# Patient Record
Sex: Female | Born: 1935 | Race: White | Hispanic: No | Marital: Married | Smoking: Never smoker
Health system: Southern US, Community
[De-identification: ages and names within clinical notes are randomized; demographics above are authoritative.]

## PROBLEM LIST (undated history)

## (undated) ENCOUNTER — Emergency Department (HOSPITAL_BASED_OUTPATIENT_CLINIC_OR_DEPARTMENT_OTHER): Discharge status: Against Medical Advice | Payer: Self-pay

## (undated) DIAGNOSIS — G8929 Other chronic pain: Secondary | ICD-10-CM

## (undated) DIAGNOSIS — K219 Gastro-esophageal reflux disease without esophagitis: Secondary | ICD-10-CM

## (undated) DIAGNOSIS — M069 Rheumatoid arthritis, unspecified: Secondary | ICD-10-CM

## (undated) DIAGNOSIS — F410 Panic disorder [episodic paroxysmal anxiety] without agoraphobia: Secondary | ICD-10-CM

## (undated) HISTORY — PX: CHOLECYSTECTOMY: SHX55

## (undated) HISTORY — PX: KNEE SURGERY: SHX244

---

## 2011-10-22 ENCOUNTER — Emergency Department (HOSPITAL_BASED_OUTPATIENT_CLINIC_OR_DEPARTMENT_OTHER)
Admission: EM | Admit: 2011-10-22 | Discharge: 2011-10-22 | Disposition: A | Discharge status: Home / Self Care | Payer: Medicare Other | Attending: Emergency Medicine | Admitting: Emergency Medicine

## 2011-10-22 ENCOUNTER — Encounter (HOSPITAL_BASED_OUTPATIENT_CLINIC_OR_DEPARTMENT_OTHER): Payer: Self-pay | Admitting: *Deleted

## 2011-10-22 DIAGNOSIS — F411 Generalized anxiety disorder: Secondary | ICD-10-CM | POA: Insufficient documentation

## 2011-10-22 DIAGNOSIS — Z79899 Other long term (current) drug therapy: Secondary | ICD-10-CM | POA: Insufficient documentation

## 2011-10-22 DIAGNOSIS — R21 Rash and other nonspecific skin eruption: Secondary | ICD-10-CM | POA: Insufficient documentation

## 2011-10-22 DIAGNOSIS — L304 Erythema intertrigo: Secondary | ICD-10-CM

## 2011-10-22 DIAGNOSIS — M5430 Sciatica, unspecified side: Secondary | ICD-10-CM

## 2011-10-22 DIAGNOSIS — M549 Dorsalgia, unspecified: Secondary | ICD-10-CM | POA: Insufficient documentation

## 2011-10-22 DIAGNOSIS — K219 Gastro-esophageal reflux disease without esophagitis: Secondary | ICD-10-CM | POA: Insufficient documentation

## 2011-10-22 HISTORY — DX: Panic disorder (episodic paroxysmal anxiety): F41.0

## 2011-10-22 HISTORY — DX: Gastro-esophageal reflux disease without esophagitis: K21.9

## 2011-10-22 MED ORDER — HYDROMORPHONE HCL PF 2 MG/ML IJ SOLN
2.0000 mg | Freq: Once | INTRAMUSCULAR | Status: AC
  Administered 2011-10-22: 2 mg via INTRAMUSCULAR
  Filled 2011-10-22: qty 1

## 2011-10-22 MED ORDER — OXYCODONE-ACETAMINOPHEN 5-325 MG PO TABS: 1.0000 | ORAL_TABLET | Freq: Four times a day (QID) | ORAL | Status: AC | PRN

## 2011-10-22 MED ORDER — KETOROLAC TROMETHAMINE 60 MG/2ML IM SOLN
60.0000 mg | Freq: Once | INTRAMUSCULAR | Status: AC
  Administered 2011-10-22: 60 mg via INTRAMUSCULAR
  Filled 2011-10-22: qty 2

## 2011-10-22 NOTE — ED Notes (Addendum)
Patient started having leg pain around July 10, primary MD had her take prednisone, which initially relieved pain, but has returned with lower back and leg   Pain. MD scheduled an epidural for aug 27. Also has a rash upper chest. Had a MRI last week

## 2011-10-22 NOTE — ED Provider Notes (Signed)
History     CSN: [id number]  Arrival date & time 10/22/11  1040   First MD Initiated Contact with Patient 10/22/11 1054      Chief Complaint  Patient presents with  . Back Pain  . Rash    (Consider location/radiation/quality/duration/timing/severity/associated sxs/prior treatment) HPI Pt with history of sciatica has had increased pain for the last several weeks, seen by her orthpedist and given Rx for steroids without improvement. MRI was done recently and she was told she would need an epidural injection. She has severe aching L sided back and buttock pain, radiating into her L leg. No new injuries, not relieved with Vicodin at home.  She also reports a red, itching rash in her armpits bilaterally onset 3 days ago and worsening until today. She has had similar rash in her groin felt to be fungal infection and improved with Clotrimazole/betamethazone lotion. Denies any new exposures except a muscle relaxer she took this morning for the first time, however as mentioned her rash started 2 days prior.  Past Medical History  Diagnosis Date  . Acid reflux   . Panic attacks     Past Surgical History  Procedure Date  . Knee surgery     bilaterla  . Cholecystectomy     No family history on file.  History  Substance Use Topics  . Smoking status: Never Smoker   . Smokeless tobacco: Not on file  . Alcohol Use: No    OB History    Grav Para Term Preterm Abortions TAB SAB Ect Mult Living                  Review of Systems All other systems reviewed and are negative except as noted in HPI.   Allergies  Review of patient's allergies indicates no known allergies.  Home Medications   Current Outpatient Rx  Name Route Sig Dispense Refill  . ALPRAZOLAM 0.25 MG PO TABS Oral Take 0.25 mg by mouth at bedtime as needed.    . CELEBREX PO Oral Take by mouth.    . CLOTRIMAZOLE-BETAMETHASONE 1-0.05 % EX CREA Topical Apply topically 2 (two) times daily.    Marland Kitchen ESOMEPRAZOLE MAGNESIUM 40  MG PO CPDR Oral Take 40 mg by mouth daily before breakfast.    . HYDROCODONE-ACETAMINOPHEN 5-500 MG PO CAPS Oral Take 1 capsule by mouth every 6 (six) hours as needed.    Marland Kitchen METAXALONE 800 MG PO TABS Oral Take 800 mg by mouth 3 (three) times daily.      BP 160/69  Pulse 99  Temp 97.9 F (36.6 C) (Oral)  Resp 18  SpO2 100%  Physical Exam  Nursing note and vitals reviewed. Constitutional: She is oriented to person, place, and time. She appears well-developed and well-nourished.  HENT:  Head: Normocephalic and atraumatic.  Eyes: EOM are normal. Pupils are equal, round, and reactive to light.  Neck: Normal range of motion. Neck supple.  Cardiovascular: Normal rate, normal heart sounds and intact distal pulses.   Pulmonary/Chest: Effort normal and breath sounds normal.  Abdominal: Bowel sounds are normal. She exhibits no distension. There is no tenderness.  Musculoskeletal: Normal range of motion. She exhibits tenderness. She exhibits no edema.       Tender in L sciatic notch  Neurological: She is alert and oriented to person, place, and time. She has normal strength. No cranial nerve deficit or sensory deficit.  Skin: Skin is warm and dry. Rash (erythematous, non vesicular rash in both axillae) noted.  Psychiatric: She has a normal mood and affect.    ED Course  Procedures (including critical care time)  Labs Reviewed - No data to display No results found.   No diagnosis found.    MDM  Rash is likely fungal given history of same in groin, less likely allergic in etiology. She has a combination antifungal/steroid cream she was advised to use for this rash. No concern for shingles given appearance and bilaterality. No evidence of cellulitis. Pt has had two recent courses of oral steroids for her back, will avoid a third. Given pain medications IM here, plan for discharge with Ortho followup.         Charles B. Bernette Mayers, MD 10/22/11 1226

## 2011-10-29 ENCOUNTER — Encounter (HOSPITAL_BASED_OUTPATIENT_CLINIC_OR_DEPARTMENT_OTHER): Payer: Self-pay | Admitting: *Deleted

## 2011-10-29 ENCOUNTER — Emergency Department (HOSPITAL_BASED_OUTPATIENT_CLINIC_OR_DEPARTMENT_OTHER)
Admission: EM | Admit: 2011-10-29 | Discharge: 2011-10-29 | Disposition: A | Discharge status: Home / Self Care | Payer: Medicare Other | Attending: Emergency Medicine | Admitting: Emergency Medicine

## 2011-10-29 DIAGNOSIS — M545 Low back pain, unspecified: Secondary | ICD-10-CM | POA: Insufficient documentation

## 2011-10-29 DIAGNOSIS — G8929 Other chronic pain: Secondary | ICD-10-CM | POA: Insufficient documentation

## 2011-10-29 DIAGNOSIS — F410 Panic disorder [episodic paroxysmal anxiety] without agoraphobia: Secondary | ICD-10-CM | POA: Insufficient documentation

## 2011-10-29 DIAGNOSIS — M549 Dorsalgia, unspecified: Secondary | ICD-10-CM

## 2011-10-29 DIAGNOSIS — K219 Gastro-esophageal reflux disease without esophagitis: Secondary | ICD-10-CM | POA: Insufficient documentation

## 2011-10-29 MED ORDER — OXYCODONE-ACETAMINOPHEN 5-325 MG PO TABS: 1.0000 | ORAL_TABLET | Freq: Four times a day (QID) | ORAL | Status: AC | PRN

## 2011-10-29 NOTE — ED Notes (Signed)
Family at bedside. 

## 2011-10-29 NOTE — ED Provider Notes (Signed)
History  This chart was scribed for Shelda Jakes, MD by Ladona Ridgel Day. This patient was seen in room MHT13/MHT13 and the patient's care was started at 1430.   CSN: [id number]  Arrival date & time 10/29/11  1430   First MD Initiated Contact with Patient 10/29/11 1507      Chief Complaint  Patient presents with  . Back Pain   Patient is a 78 y.o. female presenting with back pain. The history is provided by the patient. No language interpreter was used.  Back Pain  This is a chronic problem. The current episode started more than 1 week ago. The problem occurs constantly. The problem has been gradually worsening. The pain is associated with no known injury. The pain is present in the lumbar spine. The pain does not radiate. The pain is moderate. The pain is the same all the time. Pertinent negatives include no chest pain, no fever, no numbness, no abdominal pain, no dysuria and no weakness. Treatments tried: Percocet. The treatment provided mild relief.  Jodi Fox is a 78 y.o. female who presents to the Emergency Department with chronic back pain complaining of needing a prescription refill for her back pain. She states has an epidural scheduled in 3 days and is running out of her medication. She has four percocet tablets left of her originally 50 prescribed. She states that she gets an epidural every few years which significantly helps her back pain but she states that she will run out of her pain medicine before she gets her epidural in 3 days. Her last epidural was 7 years ago. She also complains of associated chronic left knee pain. She denies any numbness/weakness of her left foot. She denies any SOB, chest pain, fever, and urinary symptoms.  Her PCP is Dr. Joanie Coddington at [facility] Her orthopedist is Dr. Reinaldo Raddle at [facility] Past Medical History  Diagnosis Date  . Acid reflux   . Panic attacks     Past Surgical History  Procedure Date  . Knee surgery     bilaterla  . Cholecystectomy      History reviewed. No pertinent family history.  History  Substance Use Topics  . Smoking status: Never Smoker   . Smokeless tobacco: Not on file  . Alcohol Use: No    OB History    Grav Para Term Preterm Abortions TAB SAB Ect Mult Living                  Review of Systems  Constitutional: Negative for fever and chills.  HENT: Negative for congestion and neck pain.   Respiratory: Negative for shortness of breath.   Cardiovascular: Negative for chest pain.  Gastrointestinal: Negative for nausea, vomiting, abdominal pain and diarrhea.  Genitourinary: Negative for dysuria and difficulty urinating.  Musculoskeletal: Positive for back pain.       Chronic left knee pain.   Neurological: Negative for weakness and numbness.  All other systems reviewed and are negative.    Allergies  Review of patient's allergies indicates no known allergies.  Home Medications   Current Outpatient Rx  Name Route Sig Dispense Refill  . ALPRAZOLAM 0.25 MG PO TABS Oral Take 0.25 mg by mouth at bedtime as needed.    . CELEBREX PO Oral Take by mouth.    . CLOTRIMAZOLE-BETAMETHASONE 1-0.05 % EX CREA Topical Apply topically 2 (two) times daily.    Marland Kitchen ESOMEPRAZOLE MAGNESIUM 40 MG PO CPDR Oral Take 40 mg by mouth daily before breakfast.    .  METAXALONE 800 MG PO TABS Oral Take 800 mg by mouth 3 (three) times daily.    . OXYCODONE-ACETAMINOPHEN 5-325 MG PO TABS Oral Take 1-2 tablets by mouth every 6 (six) hours as needed for pain. 50 tablet 0  . OXYCODONE-ACETAMINOPHEN 5-325 MG PO TABS Oral Take 1-2 tablets by mouth every 6 (six) hours as needed for pain. 50 tablet 0    Triage Vitals: BP 144/55  Pulse 80  Temp 97.5 F (36.4 C) (Oral)  Resp 20  Ht 5\' 3"  (1.6 m)  Wt 145 lb (65.772 kg)  BMI 25.69 kg/m2  SpO2 100%  Physical Exam  Nursing note and vitals reviewed. Constitutional: She is oriented to person, place, and time. She appears well-developed and well-nourished. No distress.  HENT:    Head: Normocephalic and atraumatic.  Eyes: EOM are normal.  Neck: Neck supple. No tracheal deviation present.  Cardiovascular: Normal rate, regular rhythm and normal heart sounds.   No murmur heard. Pulmonary/Chest: Effort normal and breath sounds normal. No respiratory distress. She has no wheezes. She has no rales.  Abdominal: Soft. Bowel sounds are normal. She exhibits no distension. There is no tenderness. There is no rebound and no guarding.  Musculoskeletal: Normal range of motion.  Neurological: She is alert and oriented to person, place, and time.  Skin: Skin is warm and dry.  Psychiatric: She has a normal mood and affect. Her behavior is normal.    ED Course  Procedures (including critical care time) DIAGNOSTIC STUDIES: Oxygen Saturation is 100% on room air, normal by my interpretation.    COORDINATION OF CARE: At 335 PM Discussed treatment plan with patient which includes pain medicine. Patient agrees.   Labs Reviewed - No data to display No results found.   1. Back pain       MDM    Patient seen here one week ago assessment chronic back pain issues is followed by orthopedics in [place] is scheduled for an epidural which is helped her in the past. She ran out of her Percocet as not to get her until the epidural is done which can be done this week coming up. No snig changes in her pain pattern or Burow focal changes.   I personally performed the services described in this documentation, which was scribed in my presence. The recorded information has been reviewed and considered.           Shelda Jakes, MD 10/29/11 [phone]

## 2011-10-29 NOTE — ED Notes (Signed)
Pt states she has been having back problems since mid July. Has had xrays and MRI and is scheduled for an epidural on Tues. "Running out of pain meds. " Seen here for same on the 17th. Given injections.

## 2011-11-27 ENCOUNTER — Emergency Department (HOSPITAL_BASED_OUTPATIENT_CLINIC_OR_DEPARTMENT_OTHER)
Admission: EM | Admit: 2011-11-27 | Discharge: 2011-11-28 | Disposition: A | Discharge status: Home / Self Care | Payer: Medicare Other | Attending: Emergency Medicine | Admitting: Emergency Medicine

## 2011-11-27 ENCOUNTER — Encounter (HOSPITAL_BASED_OUTPATIENT_CLINIC_OR_DEPARTMENT_OTHER): Payer: Self-pay | Admitting: *Deleted

## 2011-11-27 DIAGNOSIS — G8929 Other chronic pain: Secondary | ICD-10-CM | POA: Insufficient documentation

## 2011-11-27 DIAGNOSIS — Z881 Allergy status to other antibiotic agents status: Secondary | ICD-10-CM | POA: Insufficient documentation

## 2011-11-27 DIAGNOSIS — M549 Dorsalgia, unspecified: Secondary | ICD-10-CM | POA: Insufficient documentation

## 2011-11-27 DIAGNOSIS — Z888 Allergy status to other drugs, medicaments and biological substances status: Secondary | ICD-10-CM | POA: Insufficient documentation

## 2011-11-27 HISTORY — DX: Other chronic pain: G89.29

## 2011-11-27 MED ORDER — KETOROLAC TROMETHAMINE 60 MG/2ML IM SOLN
60.0000 mg | Freq: Once | INTRAMUSCULAR | Status: AC
  Administered 2011-11-27: 60 mg via INTRAMUSCULAR
  Filled 2011-11-27: qty 2

## 2011-11-27 MED ORDER — HYDROMORPHONE HCL PF 2 MG/ML IJ SOLN
2.0000 mg | Freq: Once | INTRAMUSCULAR | Status: AC
  Administered 2011-11-27: 2 mg via INTRAMUSCULAR
  Filled 2011-11-27: qty 1

## 2011-11-27 NOTE — ED Notes (Signed)
MD at bedside. 

## 2011-11-27 NOTE — ED Notes (Signed)
Pt has been in treatment for pinched nerve- c/o increased pain in neck shoulder and hands since wednesday

## 2011-11-28 MED ORDER — OXYCODONE-ACETAMINOPHEN 5-325 MG PO TABS: 1.0000 | ORAL_TABLET | ORAL | Status: DC | PRN

## 2011-11-28 NOTE — ED Provider Notes (Signed)
History     CSN: [id number]  Arrival date & time 11/27/11  2145   First MD Initiated Contact with Patient 11/27/11 2238      Chief Complaint  Patient presents with  . 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 She has been treated for "pinched nerve" since last summer. She has been followed by Dr. Louis Matte, her orthopedist, and by Dr. Adele Dan, her physical medicine and rehabilitation specialist..  she has been treated with epidural injections with some relief. She had been given a steroid injection her left hip, which she feels is given her adverse reaction, the feeling that the steroid is invaded her bicycle. She says this has made her ache all over. More recently, she has had pain in her neck with radiation to the arms. She had been seen 2 days ago and had cervical spine films but doesn't know what they showed. Her pain was quite severe this evening and she therefore sought evaluation.  Patient is a 78 y.o. female presenting with back pain. The history is provided by the patient, the spouse and medical records. No language interpreter was used.  Back Pain  This is a chronic problem. Episode onset: Back pain on and off for several months. The problem occurs daily. The problem has been gradually worsening. The pain is associated with no known injury. The pain is present in the lumbar spine. The quality of the pain is described as shooting. The pain radiates to the left thigh. The pain is at a severity of 9/10. The pain is severe. The symptoms are aggravated by certain positions. Associated symptoms include paresthesias. Treatments tried: Prior treatment with epidural injections, with transient improvement. Risk factors include lack of exercise and a sedentary lifestyle.    Past Medical History  Diagnosis Date  . Acid  reflux   . Panic attacks   . Chronic pain     Past Surgical History  Procedure Date  . Knee surgery     bilaterla  . Cholecystectomy     No family history on file.  History  Substance Use Topics  . Smoking status: Never Smoker   . Smokeless tobacco: Never Used  . Alcohol Use: No    OB History    Grav Para Term Preterm Abortions TAB SAB Ect Mult Living                  Review of Systems  Musculoskeletal: Positive for back pain.  Neurological: Positive for paresthesias.  All other systems reviewed and are negative.    Allergies  Prednisone and Statins  Home Medications   Current Outpatient Rx  Name Route Sig Dispense Refill  . OXYCODONE-ACETAMINOPHEN 5-500 MG PO CAPS Oral Take 2 capsules by mouth every 4 (four) hours as needed.    . ALPRAZOLAM 0.25 MG PO TABS Oral Take 0.25 mg by mouth at bedtime as needed.    . CELEBREX PO Oral Take by mouth.    . CLOTRIMAZOLE-BETAMETHASONE 1-0.05 % EX CREA Topical Apply topically 2 (two) times daily.    Marland Kitchen ESOMEPRAZOLE MAGNESIUM 40 MG PO CPDR Oral Take 40 mg by mouth daily before breakfast.    . METAXALONE 800 MG PO TABS Oral Take 800 mg by mouth 3 (three) times daily.    . OXYCODONE-ACETAMINOPHEN 5-325 MG PO TABS Oral Take 1 tablet by mouth every 4 (four) hours as needed for  pain. 20 tablet 0    BP 129/56  Pulse 108  Temp 98.4 F (36.9 C) (Oral)  Resp 18  Ht 5\' 3"  (1.6 m)  Wt 140 lb (63.504 kg)  BMI 24.80 kg/m2  SpO2 97%  Physical Exam  Nursing note and vitals reviewed. Constitutional: She is oriented to person, place, and time. She appears well-developed and well-nourished.       In moderate distress with neck and low back pain.  HENT:  Head: Normocephalic and atraumatic.  Right Ear: External ear normal.  Left Ear: External ear normal.  Mouth/Throat: Oropharynx is clear and moist.  Eyes: Conjunctivae normal and EOM are normal. Pupils are equal, round, and reactive to light.  Neck: Neck supple.       Some pain  elicited on rotary movement of her neck.    Cardiovascular: Normal rate, regular rhythm and normal heart sounds.   Pulmonary/Chest: Effort normal and breath sounds normal.  Abdominal: Soft. Bowel sounds are normal.  Musculoskeletal:       No palpable bony deformity or point tenderness over the cervical, thoracic, or lumbar spine.  Neurological: She is alert and oriented to person, place, and time.       No sensory or motor deficit.  Skin: Skin is warm and dry.  Psychiatric: She has a normal mood and affect. Her behavior is normal.    ED Course  Procedures (including critical care time)  DISP:  Pt. was seen and had physical examination. Old charts were reviewed. She was given an injection of Toradol and Dilaudid. She got good relief from this. She was given a prescription for Percocet every 4 hours as needed for pain. She is to followup with her physical medicine and rehabilitation Dr., Dr. Adele Dan.    1. Back pain      Carleene Cooper III, MD 11/28/11 [phone]

## 2011-12-06 ENCOUNTER — Encounter (HOSPITAL_BASED_OUTPATIENT_CLINIC_OR_DEPARTMENT_OTHER): Payer: Self-pay | Admitting: *Deleted

## 2011-12-06 ENCOUNTER — Emergency Department (HOSPITAL_BASED_OUTPATIENT_CLINIC_OR_DEPARTMENT_OTHER)
Admission: EM | Admit: 2011-12-06 | Discharge: 2011-12-06 | Disposition: A | Discharge status: Home / Self Care | Payer: Medicare Other | Attending: Emergency Medicine | Admitting: Emergency Medicine

## 2011-12-06 DIAGNOSIS — M549 Dorsalgia, unspecified: Secondary | ICD-10-CM | POA: Insufficient documentation

## 2011-12-06 DIAGNOSIS — F411 Generalized anxiety disorder: Secondary | ICD-10-CM | POA: Insufficient documentation

## 2011-12-06 DIAGNOSIS — K219 Gastro-esophageal reflux disease without esophagitis: Secondary | ICD-10-CM | POA: Insufficient documentation

## 2011-12-06 DIAGNOSIS — G8929 Other chronic pain: Secondary | ICD-10-CM | POA: Insufficient documentation

## 2011-12-06 MED ORDER — KETOROLAC TROMETHAMINE 60 MG/2ML IM SOLN
60.0000 mg | Freq: Once | INTRAMUSCULAR | Status: AC
  Administered 2011-12-06: 60 mg via INTRAMUSCULAR
  Filled 2011-12-06: qty 2

## 2011-12-06 MED ORDER — HYDROMORPHONE HCL PF 1 MG/ML IJ SOLN
1.0000 mg | Freq: Once | INTRAMUSCULAR | Status: AC
  Administered 2011-12-06: 1 mg via INTRAMUSCULAR
  Filled 2011-12-06: qty 1

## 2011-12-06 NOTE — ED Provider Notes (Signed)
History     CSN: [id number]  Arrival date & time 12/06/11  1933   First MD Initiated Contact with Patient 12/06/11 1951      Chief Complaint  Patient presents with  . Back Pain    (Consider location/radiation/quality/duration/timing/severity/associated sxs/prior treatment) HPI Pt with history of chronic back pain, has been seen several times in the ED recently as well as appointments with her other outpatient doctors. Was feeling better after epidural injection a few weeks ago but pain has returned and worsened for the last several days. In addition to her back pain, she now also complains of neck, R knee and bilateral hand pain. Apparently had bloodwork drawn at PCP office today concerning for rheumatoid arthritis. Pt has been taking her oxycodone without improvement today. No fevers. No falls or injuries.   Past Medical History  Diagnosis Date  . Acid reflux   . Panic attacks   . Chronic pain     Past Surgical History  Procedure Date  . Knee surgery     bilaterla  . Cholecystectomy     No family history on file.  History  Substance Use Topics  . Smoking status: Never Smoker   . Smokeless tobacco: Never Used  . Alcohol Use: No    OB History    Grav Para Term Preterm Abortions TAB SAB Ect Mult Living                  Review of Systems All other systems reviewed and are negative except as noted in HPI.   Allergies  Prednisone and Statins  Home Medications   Current Outpatient Rx  Name Route Sig Dispense Refill  . ALPRAZOLAM 0.25 MG PO TABS Oral Take 0.25 mg by mouth at bedtime as needed.    . CELEBREX PO Oral Take by mouth.    . CLOTRIMAZOLE-BETAMETHASONE 1-0.05 % EX CREA Topical Apply topically 2 (two) times daily.    Marland Kitchen ESOMEPRAZOLE MAGNESIUM 40 MG PO CPDR Oral Take 40 mg by mouth daily before breakfast.    . METAXALONE 800 MG PO TABS Oral Take 800 mg by mouth 3 (three) times daily.    . OXYCODONE-ACETAMINOPHEN 5-325 MG PO TABS Oral Take 1 tablet by mouth  every 4 (four) hours as needed for pain. 20 tablet 0  . OXYCODONE-ACETAMINOPHEN 5-500 MG PO CAPS Oral Take 2 capsules by mouth every 4 (four) hours as needed.      BP 117/78  Pulse 104  Temp 99.2 F (37.3 C) (Oral)  Resp 18  SpO2 98%  Physical Exam  Nursing note and vitals reviewed. Constitutional: She is oriented to person, place, and time. She appears well-developed and well-nourished.       Uncomfortable appearing  HENT:  Head: Normocephalic and atraumatic.  Eyes: EOM are normal. Pupils are equal, round, and reactive to light.  Neck: Normal range of motion. Neck supple.  Cardiovascular: Normal rate, normal heart sounds and intact distal pulses.   Pulmonary/Chest: Effort normal and breath sounds normal.  Abdominal: Bowel sounds are normal. She exhibits no distension. There is no tenderness.  Musculoskeletal: Normal range of motion. She exhibits tenderness (tenderness to light touch diffusely, particularly over the strap muscles in the neck and the hands bilaterally). She exhibits no edema.  Neurological: She is alert and oriented to person, place, and time. She has normal strength. No cranial nerve deficit or sensory deficit.  Skin: Skin is warm and dry. No rash noted.  Psychiatric: She has a normal mood  and affect.    ED Course  Procedures (including critical care time)  Labs Reviewed - No data to display No results found.   No diagnosis found.    MDM  Exacerbation of chronic pain. Pt advised that the ED could no longer prescribe her narcotic medications and that she would need to followup with her outpatient doctors tomorrow for long term pain control. She was given IM medications in the ED. Will reassess.   9:36 PM Called to room to speak to husband who is asking for 'an ambulance to take [place] to [facility]'. Advised that an ambulance would not come to the ED if he called. Offered additional pain medications but he refuses. States he wants to leave now. Advised to return  for any concerns. Advises that the ED is always available to evaluate the patient if needed for future exacerbations of her chronic pain.        Trude Cansler B. Bernette Mayers, MD 12/06/11 2139

## 2011-12-06 NOTE — ED Notes (Signed)
Pt's family requesting that pt be dc'd so that she can go to [facility] for further treatment. Pt offered more pain meds by EDP but pt declined at this time. Pt placed in w/c, discharge papers given and pt placed in car with assistance. Pt in NAD distress upon d/c.

## 2011-12-06 NOTE — ED Notes (Signed)
Lower back pain. MRI a month ago. She had an epidural injection and into her left hip for pain. Here for pain in her hands and upper back.

## 2019-02-22 ENCOUNTER — Other Ambulatory Visit: Payer: Self-pay

## 2019-02-22 ENCOUNTER — Emergency Department (HOSPITAL_BASED_OUTPATIENT_CLINIC_OR_DEPARTMENT_OTHER): Payer: Medicare Other

## 2019-02-22 ENCOUNTER — Encounter (HOSPITAL_BASED_OUTPATIENT_CLINIC_OR_DEPARTMENT_OTHER): Payer: Self-pay | Admitting: Emergency Medicine

## 2019-02-22 ENCOUNTER — Emergency Department (HOSPITAL_BASED_OUTPATIENT_CLINIC_OR_DEPARTMENT_OTHER)
Admission: EM | Admit: 2019-02-22 | Discharge: 2019-02-23 | Disposition: A | Discharge status: Home / Self Care | Payer: Medicare Other | Attending: Emergency Medicine | Admitting: Emergency Medicine

## 2019-02-22 DIAGNOSIS — S79912A Unspecified injury of left hip, initial encounter: Secondary | ICD-10-CM

## 2019-02-22 DIAGNOSIS — S73192A Other sprain of left hip, initial encounter: Secondary | ICD-10-CM

## 2019-02-22 DIAGNOSIS — N3000 Acute cystitis without hematuria: Secondary | ICD-10-CM

## 2019-02-22 DIAGNOSIS — Z79899 Other long term (current) drug therapy: Secondary | ICD-10-CM | POA: Insufficient documentation

## 2019-02-22 DIAGNOSIS — Z96641 Presence of right artificial hip joint: Secondary | ICD-10-CM | POA: Insufficient documentation

## 2019-02-22 DIAGNOSIS — Y929 Unspecified place or not applicable: Secondary | ICD-10-CM | POA: Insufficient documentation

## 2019-02-22 DIAGNOSIS — Y999 Unspecified external cause status: Secondary | ICD-10-CM | POA: Diagnosis not present

## 2019-02-22 DIAGNOSIS — Z20828 Contact with and (suspected) exposure to other viral communicable diseases: Secondary | ICD-10-CM | POA: Insufficient documentation

## 2019-02-22 DIAGNOSIS — Y939 Activity, unspecified: Secondary | ICD-10-CM | POA: Diagnosis not present

## 2019-02-22 DIAGNOSIS — E871 Hypo-osmolality and hyponatremia: Secondary | ICD-10-CM

## 2019-02-22 DIAGNOSIS — Z96653 Presence of artificial knee joint, bilateral: Secondary | ICD-10-CM | POA: Insufficient documentation

## 2019-02-22 DIAGNOSIS — R2689 Other abnormalities of gait and mobility: Secondary | ICD-10-CM

## 2019-02-22 DIAGNOSIS — X58XXXA Exposure to other specified factors, initial encounter: Secondary | ICD-10-CM | POA: Insufficient documentation

## 2019-02-22 DIAGNOSIS — R252 Cramp and spasm: Secondary | ICD-10-CM

## 2019-02-22 HISTORY — DX: Rheumatoid arthritis, unspecified: M06.9

## 2019-02-22 LAB — COMPREHENSIVE METABOLIC PANEL
ALT: 16 U/L (ref 0–44)
AST: 18 U/L (ref 15–41)
Albumin: 3.4 g/dL — ABNORMAL LOW (ref 3.5–5.0)
Alkaline Phosphatase: 46 U/L (ref 38–126)
Anion gap: 7 (ref 5–15)
BUN: 17 mg/dL (ref 8–23)
CO2: 23 mmol/L (ref 22–32)
Calcium: 8.7 mg/dL — ABNORMAL LOW (ref 8.9–10.3)
Chloride: 96 mmol/L — ABNORMAL LOW (ref 98–111)
Creatinine, Ser: 0.96 mg/dL (ref 0.44–1.00)
GFR calc Af Amer: >60 mL/min (ref >60)
GFR calc non Af Amer: 55 mL/min — ABNORMAL LOW (ref >60)
Glucose, Bld: 110 mg/dL — ABNORMAL HIGH (ref 70–99)
Potassium: 4.3 mmol/L (ref 3.5–5.1)
Sodium: 126 mmol/L — ABNORMAL LOW (ref 135–145)
Total Bilirubin: 1 mg/dL (ref 0.3–1.2)
Total Protein: 6.7 g/dL (ref 6.5–8.1)

## 2019-02-22 LAB — URINALYSIS, ROUTINE W REFLEX MICROSCOPIC
Bilirubin Urine: NEGATIVE
Glucose, UA: NEGATIVE mg/dL
Ketones, ur: NEGATIVE mg/dL
Nitrite: NEGATIVE
Protein, ur: NEGATIVE mg/dL
Specific Gravity, Urine: 1.01 (ref 1.005–1.030)
pH: 7.5 (ref 5.0–8.0)

## 2019-02-22 LAB — CBC WITH DIFFERENTIAL/PLATELET
Abs Immature Granulocytes: 0.03 10*3/uL (ref 0.00–0.07)
Basophils Absolute: 0 10*3/uL (ref 0.0–0.1)
Basophils Relative: 0 %
Eosinophils Absolute: 0.2 10*3/uL (ref 0.0–0.5)
Eosinophils Relative: 3 %
HCT: 29.3 % — ABNORMAL LOW (ref 36.0–46.0)
Hemoglobin: 10 g/dL — ABNORMAL LOW (ref 12.0–15.0)
Immature Granulocytes: 1 %
Lymphocytes Relative: 16 %
Lymphs Abs: 1 10*3/uL (ref 0.7–4.0)
MCH: 33.1 pg (ref 26.0–34.0)
MCHC: 34.1 g/dL (ref 30.0–36.0)
MCV: 97 fL (ref 80.0–100.0)
Monocytes Absolute: 0.8 10*3/uL (ref 0.1–1.0)
Monocytes Relative: 14 %
Neutro Abs: 3.9 10*3/uL (ref 1.7–7.7)
Neutrophils Relative %: 66 %
Platelets: 175 10*3/uL (ref 150–400)
RBC: 3.02 MIL/uL — ABNORMAL LOW (ref 3.87–5.11)
RDW: 13.5 % (ref 11.5–15.5)
WBC: 5.8 10*3/uL (ref 4.0–10.5)
nRBC: 0 % (ref 0.0–0.2)

## 2019-02-22 LAB — URINALYSIS, MICROSCOPIC (REFLEX)

## 2019-02-22 LAB — CK: Total CK: 33 U/L — ABNORMAL LOW (ref 38–234)

## 2019-02-22 MED ORDER — DIAZEPAM 2 MG PO TABS
2.0000 mg | ORAL_TABLET | Freq: Once | ORAL | Status: AC
  Administered 2019-02-22: 23:00:00 2 mg via ORAL
  Filled 2019-02-22: qty 1

## 2019-02-22 MED ORDER — FENTANYL CITRATE (PF) 100 MCG/2ML IJ SOLN
50.0000 ug | Freq: Once | INTRAMUSCULAR | Status: AC
  Administered 2019-02-22: 50 ug via INTRAVENOUS
  Filled 2019-02-22: qty 2

## 2019-02-22 MED ORDER — SODIUM CHLORIDE 0.9 % IV BOLUS
500.0000 mL | Freq: Once | INTRAVENOUS | Status: AC
  Administered 2019-02-22: 23:00:00 500 mL via INTRAVENOUS

## 2019-02-22 MED ORDER — CEPHALEXIN 500 MG PO CAPS: 500.0000 mg | ORAL_CAPSULE | Freq: Four times a day (QID) | ORAL | 0 refills | Status: DC

## 2019-02-22 MED ORDER — SODIUM CHLORIDE 0.9 % IV SOLN
1.0000 g | Freq: Once | INTRAVENOUS | Status: AC
  Administered 2019-02-22: 1 g via INTRAVENOUS
  Filled 2019-02-22: qty 10

## 2019-02-22 NOTE — ED Provider Notes (Signed)
[facility] EMERGENCY DEPARTMENT Provider Note   CSN: [id number] Arrival date & time: 02/22/19  1938     History Chief Complaint  Patient presents with  . Leg Pain    Jodi Fox is a 85 y.o. female.  Pt presents to the ED today with left upper leg pain.  The pt said it started hurting yesterday.  She denies any trauma.  She took a Microbiologistpercocet around [id number] which has helped with the pain.  It is worse with bearing weight.  Pt denies any redness, masses, or swelling.         Past Medical History:  Diagnosis Date  . Acid reflux   . Chronic pain   . Panic attacks     Past Medical History:  Diagnosis Date  . Acute urinary tract infection  . Adjustment disorder with anxious mood  . Anterior basement membrane dystrophy  . Benign essential hypertension  . Bilateral pseudophakia  . Blepharitis of upper and lower eyelids of both eyes  . BPPV (benign paroxysmal positional vertigo)  . Cervical spondylosis  . Costochondritis  . Endophthalmitis 11/26/2013  . Endophthalmitis, left eye  . Esophageal stricture  . Exanthem  . Facial swelling  . Fatigue  . GERD (gastroesophageal reflux disease)  . Glaucoma  . Globus hystericus  . H/O recurrent urinary tract infection  . High risk medication use  . History of iron deficiency anemia  . Hyperlipemia  . Idiopathic peripheral neuropathy  . Keratoconjunctivitis sicca (HCC)  . Lumbar stenosis  . Macrocytosis  . Major depressive disorder, recurrent (HCC)  . Osteoarthrosis, generalized, involving multiple sites  . Osteoporosis  . Rash  . Rheumatoid arthritis involving multiple sites with positive rheumatoid factor (HCC)  . Rheumatoid arthritis(714.0)  . Trochanteric bursitis  . Vitamin D deficiency    There are no problems to display for this patient.   Past Surgical History:  Procedure Laterality Date  . CHOLECYSTECTOMY    . KNEE SURGERY     bilaterla    Past Surgical History:  Procedure Laterality Date  .  CARPAL TUNNEL RELEASE Right  . CATARACT EXTRACTION W/ INTRAOCULAR LENS IMPLANT Right 07/14/2011  . CATARACT EXTRACTION W/ INTRAOCULAR LENS IMPLANT Left 11/21/2013  . CHOLECYSTECTOMY  . intravitreal tap and injection Left 11-26-2013  . JOINT REPLACEMENT Bilateral  TKR  . KNEE ARTHROSCOPY W/ MENISCECTOMY  . KNEE ARTHROSCOPY W/ SYNOVECTOMY  . KNEE SURGERY  . SLT Left 10/08/2013  . TIBIA FRACTURE SURGERY  . TOTAL HIP REPLACEMENT - POSTERIOR APPROACH Right 03/14/2018  Procedure: TOTAL HIP REPLACEMENT - POSTERIOR APPROACH; Surgeon: Leanne LovelyKenneth Charles Lennon, MD; Location: [facility] MAIN OR; Service: Orthopedics; Laterality: Right;  . YAG CAPSULOTOMY Right 05/07/2013   OB History   No obstetric history on file.     History reviewed. No pertinent family history. Family History  Problem Relation Age of Onset  . Heart disease Mother  . Cataracts Mother  . Diabetes Father  . Heart disease Father  . Cataracts Father  . Pancreatic cancer Sister  . Diabetes Sister  . Hypertension Sister  . Heart disease Sister  . Hypertension Brother  . Cataracts Maternal Grandmother  . Cataracts Maternal Grandfather  . Cataracts Paternal Grandmother  . Cataracts Paternal Grandfather   Social History   Tobacco Use  . Smoking status: Never Smoker  . Smokeless tobacco: Never Used  Substance Use Topics  . Alcohol use: No  . Drug use: No    Home Medications Prior to Admission medications   Medication  Sig Start Date End Date Taking? Authorizing Provider  ALPRAZolam (XANAX) 0.25 MG tablet Take 0.25 mg by mouth at bedtime as needed.    [provider]  Celecoxib (CELEBREX PO) Take by mouth.    [provider]  cephALEXin (KEFLEX) 500 MG capsule Take 1 capsule (500 mg total) by mouth 4 (four) times daily. 02/22/19   Isla Pence, MD  clotrimazole-betamethasone (LOTRISONE) cream Apply topically 2 (two) times daily.    [provider]  esomeprazole (NEXIUM) 40 MG capsule Take 40 mg  by mouth daily before breakfast.    [provider]  metaxalone (SKELAXIN) 800 MG tablet Take 800 mg by mouth 3 (three) times daily.    [provider]  oxyCODONE-acetaminophen (PERCOCET/ROXICET) 5-325 MG per tablet Take 1 tablet by mouth every 4 (four) hours as needed for pain. 11/28/11   Mylinda Latina, MD  oxyCODONE-acetaminophen (TYLOX) 5-500 MG per capsule Take 2 capsules by mouth every 4 (four) hours as needed.    [provider]    Allergies    Prednisone and Statins  Review of Systems   Review of Systems  Musculoskeletal:       Left upper thigh pain  All other systems reviewed and are negative.   Physical Exam Updated Vital Signs BP (!) 117/55 (BP Location: Right Arm)   Pulse 74   Temp 99.8 F (37.7 C) (Oral)   Resp 18   Ht 5\' 3"  (1.6 m)   Wt 63.5 kg   SpO2 99%   BMI 24.80 kg/m   Physical Exam Vitals and nursing note reviewed.  Constitutional:      Appearance: Normal appearance.  HENT:     Head: Normocephalic and atraumatic.     Right Ear: External ear normal.     Left Ear: External ear normal.     Nose: Nose normal.     Mouth/Throat:     Mouth: Mucous membranes are moist.     Pharynx: Oropharynx is clear.  Eyes:     Extraocular Movements: Extraocular movements intact.     Conjunctiva/sclera: Conjunctivae normal.     Pupils: Pupils are equal, round, and reactive to light.  Cardiovascular:     Rate and Rhythm: Normal rate and regular rhythm.     Pulses: Normal pulses.     Heart sounds: Normal heart sounds.  Pulmonary:     Effort: Pulmonary effort is normal.     Breath sounds: Normal breath sounds.  Abdominal:     General: Abdomen is flat. Bowel sounds are normal.     Palpations: Abdomen is soft.  Musculoskeletal:     Cervical back: Normal range of motion and neck supple.       Legs:  Skin:    General: Skin is warm and dry.     Capillary Refill: Capillary refill takes less than 2 seconds.  Neurological:     General: No  focal deficit present.     Mental Status: She is alert and oriented to person, place, and time.  Psychiatric:        Mood and Affect: Mood normal.        Behavior: Behavior normal.        Thought Content: Thought content normal.        Judgment: Judgment normal.     ED Results / Procedures / Treatments   Labs (all labs ordered are listed, but only abnormal results are displayed) Labs Reviewed  CBC WITH DIFFERENTIAL/PLATELET - Abnormal; Notable for the following components:  Result Value   RBC 3.02 (*)    Hemoglobin 10.0 (*)    HCT 29.3 (*)    All other components within normal limits  COMPREHENSIVE METABOLIC PANEL - Abnormal; Notable for the following components:   Sodium 126 (*)    Chloride 96 (*)    Glucose, Bld 110 (*)    Calcium 8.7 (*)    Albumin 3.4 (*)    GFR calc non Af Amer 55 (*)    All other components within normal limits  URINALYSIS, ROUTINE W REFLEX MICROSCOPIC - Abnormal; Notable for the following components:   Color, Urine COLORLESS (*)    APPearance HAZY (*)    Hgb urine dipstick TRACE (*)    Leukocytes,Ua LARGE (*)    All other components within normal limits  CK - Abnormal; Notable for the following components:   Total CK 33 (*)    All other components within normal limits  URINALYSIS, MICROSCOPIC (REFLEX) - Abnormal; Notable for the following components:   Bacteria, UA MANY (*)    Non Squamous Epithelial PRESENT (*)    All other components within normal limits  SARS CORONAVIRUS 2 (TAT 6-24 HRS)    EKG None  Radiology CT Hip Left Wo Contrast  Result Date: 02/22/2019 CLINICAL DATA:  Left-sided hip pain with concern for a fracture. EXAM: CT OF THE LEFT HIP WITHOUT CONTRAST TECHNIQUE: Multidetector CT imaging of the left hip was performed according to the standard protocol. Multiplanar CT image reconstructions were also generated. COMPARISON:  None. FINDINGS: Bones/Joint/Cartilage There is diffuse osteopenia which limits detection of nondisplaced  fractures. Given this limitation, no acute displaced fracture was identified. Ligaments Suboptimally assessed by CT. Muscles and Tendons Unremarkable. Soft tissues There is sigmoid diverticulosis without CT evidence for diverticulitis. Vascular calcifications are noted. IMPRESSION: Diffuse osteopenia limits detection of nondisplaced fractures. Given this limitation, no acute displaced fracture was identified. If there is persistent high clinical suspicion for an occult fracture, follow-up with MRI is recommended. Electronically Signed   By: Katherine Mantle M.D.   On: 02/22/2019 21:37   DG Femur Min 2 Views Left  Result Date: 02/22/2019 CLINICAL DATA:  Pain, acute onset left thigh pain no history of injury. EXAM: LEFT FEMUR 2 VIEWS COMPARISON:  None FINDINGS: Joint space narrowing and osteophyte formation about the left hip. Signs of bone demineralization. Signs of left total knee arthroplasty. Vascular calcifications in the soft tissues similar to prior studies of the pelvis which are available for review. No signs of acute fracture of the left femur. IMPRESSION: Degenerative changes in the hip, post left total knee arthroplasty with osteopenia and without signs of acute fracture. Electronically Signed   By: Donzetta Kohut M.D.   On: 02/22/2019 20:51    Procedures Procedures (including critical care time)  Medications Ordered in ED Medications  cefTRIAXone (ROCEPHIN) 1 g in sodium chloride 0.9 % 100 mL IVPB (has no administration in time range)  sodium chloride 0.9 % bolus 500 mL (has no administration in time range)  diazepam (VALIUM) tablet 2 mg (2 mg Oral Given 02/22/19 2256)    ED Course  I have reviewed the triage vital signs and the nursing notes.  Pertinent labs & imaging results that were available during my care of the patient were reviewed by me and considered in my medical decision making (see chart for details).    MDM Rules/Calculators/A&P  Pt is now c/o  pain in her right thigh as well.  She said she has no hx of low sodium.  I can't find any recent old labs to confirm.  She is not on any diuretics. Pt said she is in a lot of pain still.  She took her own percocet and she was given a valium.  We will see if that helps.  Pt does have a UTI.  Keflex will be sent to the pharmacy.  Pt signed out to Dr. Read Drivers at shift change.  Covid swab is pending.  Duanna Runk was evaluated in Emergency Department on 02/22/2019 for the symptoms described in the history of present illness. She was evaluated in the context of the global COVID-19 pandemic, which necessitated consideration that the patient might be at risk for infection with the SARS-CoV-2 virus that causes COVID-19. Institutional protocols and algorithms that pertain to the evaluation of patients at risk for COVID-19 are in a state of rapid change based on information released by regulatory bodies including the CDC and federal and state organizations. These policies and algorithms were followed during the patient's care in the ED.  Final Clinical Impression(s) / ED Diagnoses Final diagnoses:  Hyponatremia  Muscle cramps  Acute cystitis without hematuria    Rx / DC Orders ED Discharge Orders         Ordered    cephALEXin (KEFLEX) 500 MG capsule  4 times daily     02/22/19 2321           Jacalyn Lefevre, MD 02/22/19 2322

## 2019-02-22 NOTE — ED Triage Notes (Signed)
  Patient comes in with L thigh pain.  Patient states it started hurting yesterday afternoon while at a party.  Patient doesn't remember hurting her leg or any injury.  Pain is about mid thigh and is constant with sharp pains.  Patient has hx of rheumatoid arthritis.  Pain minimal unless bearing weight.  Patient has been avoiding any weight bearing on L leg.

## 2019-02-23 ENCOUNTER — Encounter (HOSPITAL_COMMUNITY): Payer: Self-pay | Admitting: Emergency Medicine

## 2019-02-23 ENCOUNTER — Emergency Department (HOSPITAL_COMMUNITY): Payer: Medicare Other

## 2019-02-23 DIAGNOSIS — S73192A Other sprain of left hip, initial encounter: Secondary | ICD-10-CM | POA: Diagnosis not present

## 2019-02-23 LAB — SARS CORONAVIRUS 2 (TAT 6-24 HRS): SARS Coronavirus 2: NEGATIVE

## 2019-02-23 MED ORDER — CELECOXIB 100 MG PO CAPS
100.0000 mg | ORAL_CAPSULE | Freq: Every day | ORAL | Status: DC
  Filled 2019-02-23: qty 1

## 2019-02-23 MED ORDER — HYDROCODONE-ACETAMINOPHEN 5-325 MG PO TABS: 1.0000 | ORAL_TABLET | ORAL | 0 refills | Status: AC | PRN

## 2019-02-23 MED ORDER — CEPHALEXIN 250 MG PO CAPS
500.0000 mg | ORAL_CAPSULE | Freq: Once | ORAL | Status: AC
  Administered 2019-02-23: 500 mg via ORAL
  Filled 2019-02-23: qty 2

## 2019-02-23 MED ORDER — CEPHALEXIN 500 MG PO CAPS: 500.0000 mg | ORAL_CAPSULE | Freq: Two times a day (BID) | ORAL | 0 refills | Status: AC

## 2019-02-23 MED ORDER — CEPHALEXIN 250 MG PO CAPS: 500.0000 mg | ORAL_CAPSULE | Freq: Two times a day (BID) | ORAL | Status: DC

## 2019-02-23 MED ORDER — ALPRAZOLAM 0.25 MG PO TABS: 0.2500 mg | ORAL_TABLET | Freq: Every evening | ORAL | Status: DC | PRN

## 2019-02-23 MED ORDER — LORAZEPAM 2 MG/ML IJ SOLN
0.5000 mg | Freq: Once | INTRAMUSCULAR | Status: AC
  Administered 2019-02-23: 0.5 mg via INTRAVENOUS
  Filled 2019-02-23: qty 1

## 2019-02-23 MED ORDER — FENTANYL CITRATE (PF) 100 MCG/2ML IJ SOLN
50.0000 ug | Freq: Once | INTRAMUSCULAR | Status: AC
  Administered 2019-02-23: 50 ug via INTRAVENOUS
  Filled 2019-02-23: qty 2

## 2019-02-23 MED ORDER — ACETAMINOPHEN 325 MG PO TABS
650.0000 mg | ORAL_TABLET | Freq: Once | ORAL | Status: AC
Start: 2019-02-23 — End: 2019-02-23
  Administered 2019-02-23: 650 mg via ORAL
  Filled 2019-02-23: qty 2

## 2019-02-23 MED ORDER — PANTOPRAZOLE SODIUM 40 MG PO TBEC
40.0000 mg | DELAYED_RELEASE_TABLET | Freq: Every day | ORAL | Status: DC
  Filled 2019-02-23: qty 1

## 2019-02-23 NOTE — ED Notes (Addendum)
Pt was able to stand and take a few steps with assistance. Pt reports a little discomfort while walking.

## 2019-02-23 NOTE — ED Notes (Signed)
Daughter -  Silvestre Gunner   [phone]

## 2019-02-23 NOTE — ED Provider Notes (Signed)
Patient to ED by transfer from [facility] for MRI in further evaluation of left hip pain, r/o fracture. MRI ordered, patient waiting. Purewick placed for patient comfort.   4:50 - Patient resting. No complaint of pain.  5:45 - patient now having pain. Fentanyl 50 mcg ordered. Still waiting for MRI to be available.  Patient care signed out to oncoming provider pending MRI results.    Charlann Lange, PA-C 02/23/19 0370    Merryl Hacker, MD 02/24/19 [phone]

## 2019-02-23 NOTE — ED Notes (Signed)
Messaged EDP and PT, they will follow up with family to update.

## 2019-02-23 NOTE — ED Notes (Signed)
PT at bedside.

## 2019-02-23 NOTE — ED Notes (Addendum)
Attempted to ambulate patient with physical therapist and walker, patient would not stand.  Assisted patient to side of the bed, pt refused any weight bearing or ambulation.  EDP notified.

## 2019-02-23 NOTE — ED Notes (Signed)
Patient transported to MRI 

## 2019-02-23 NOTE — ED Provider Notes (Signed)
Assumed care of patient at change of shift awaiting MRI, see prior notes for complete history and physical. Briefly, patient with left hip pain without fall, CT indeterminant and recommended MRI to rule out fracture. Physical Exam  BP (!) 108/48   Pulse 87   Temp 99.2 F (37.3 C) (Oral)   Resp 17   Ht 5\' 3"  (1.6 m)   Wt 63.5 kg   SpO2 99%   BMI 24.80 kg/m   Physical Exam Patient is sleeping, awakes easily to verbal stimuli, no pain at this time. ED Course/Procedures     Procedures  MDM  MRI is negative for fracture, shows left labral tear.  Patient has a walker with 2 wheels at home, lives on the main floor of her house and has 2 stair entry.  Patient has been ambulating with her walker without difficulty, states her husband is physically able to assist her around the house and she has family locally.  Patient would like to be discharged home to follow-up with her orthopedist, Dr. Jacqulyn Liner next week.  Physical therapy is here in the department to evaluate patient, will make sure that she is able to ambulate safely with a walker prior to discharge. 10:16 AM PT has assessed patient, she is unable to stand with assistance of PT and RN. Will consult transition of care/case management for further assistance.  1521hrs, patient seen by Joe with SW, patient would like to discuss options with her family, SW to recheck with patient after she speaks with her family. Care signed out to oncoming provider awaiting SW evaluation and patient decision.      Tacy Learn, PA-C 02/23/19 1523    Fredia Sorrow, MD 02/24/19 1341

## 2019-02-23 NOTE — ED Notes (Signed)
Spoke with daughter and son, provided update.

## 2019-02-23 NOTE — ED Provider Notes (Signed)
Nursing notes and vitals signs, including pulse oximetry, reviewed.  Summary of this visit's results, reviewed by myself:  EKG:  EKG Interpretation  Date/Time:    Ventricular Rate:    PR Interval:    QRS Duration:   QT Interval:    QTC Calculation:   R Axis:     Text Interpretation:         Labs:  Results for orders placed or performed during the hospital encounter of 02/22/19 (from the past 24 hour(s))  CBC with Differential     Status: Abnormal   Collection Time: 02/22/19  8:21 PM  Result Value Ref Range   WBC 5.8 4.0 - 10.5 K/uL   RBC 3.02 (L) 3.87 - 5.11 MIL/uL   Hemoglobin 10.0 (L) 12.0 - 15.0 g/dL   HCT 29.3 (L) 36.0 - 46.0 %   MCV 97.0 80.0 - 100.0 fL   MCH 33.1 26.0 - 34.0 pg   MCHC 34.1 30.0 - 36.0 g/dL   RDW 13.5 11.5 - 15.5 %   Platelets 175 150 - 400 K/uL   nRBC 0.0 0.0 - 0.2 %   Neutrophils Relative % 66 %   Neutro Abs 3.9 1.7 - 7.7 K/uL   Lymphocytes Relative 16 %   Lymphs Abs 1.0 0.7 - 4.0 K/uL   Monocytes Relative 14 %   Monocytes Absolute 0.8 0.1 - 1.0 K/uL   Eosinophils Relative 3 %   Eosinophils Absolute 0.2 0.0 - 0.5 K/uL   Basophils Relative 0 %   Basophils Absolute 0.0 0.0 - 0.1 K/uL   Immature Granulocytes 1 %   Abs Immature Granulocytes 0.03 0.00 - 0.07 K/uL  Comprehensive metabolic panel     Status: Abnormal   Collection Time: 02/22/19  8:21 PM  Result Value Ref Range   Sodium 126 (L) 135 - 145 mmol/L   Potassium 4.3 3.5 - 5.1 mmol/L   Chloride 96 (L) 98 - 111 mmol/L   CO2 23 22 - 32 mmol/L   Glucose, Bld 110 (H) 70 - 99 mg/dL   BUN 17 8 - 23 mg/dL   Creatinine, Ser 0.96 0.44 - 1.00 mg/dL   Calcium 8.7 (L) 8.9 - 10.3 mg/dL   Total Protein 6.7 6.5 - 8.1 g/dL   Albumin 3.4 (L) 3.5 - 5.0 g/dL   AST 18 15 - 41 U/L   ALT 16 0 - 44 U/L   Alkaline Phosphatase 46 38 - 126 U/L   Total Bilirubin 1.0 0.3 - 1.2 mg/dL   GFR calc non Af Amer 55 (L) >60 mL/min   GFR calc Af Amer >60 >60 mL/min   Anion gap 7 5 - 15  CK     Status: Abnormal   Collection Time: 02/22/19  8:21 PM  Result Value Ref Range   Total CK 33 (L) 38 - 234 U/L  Urinalysis, Routine w reflex microscopic     Status: Abnormal   Collection Time: 02/22/19 10:00 PM  Result Value Ref Range   Color, Urine COLORLESS (A) YELLOW   APPearance HAZY (A) CLEAR   Specific Gravity, Urine 1.010 1.005 - 1.030   pH 7.5 5.0 - 8.0   Glucose, UA NEGATIVE NEGATIVE mg/dL   Hgb urine dipstick TRACE (A) NEGATIVE   Bilirubin Urine NEGATIVE NEGATIVE   Ketones, ur NEGATIVE NEGATIVE mg/dL   Protein, ur NEGATIVE NEGATIVE mg/dL   Nitrite NEGATIVE NEGATIVE   Leukocytes,Ua LARGE (A) NEGATIVE  Urinalysis, Microscopic (reflex)     Status: Abnormal   Collection  Time: 02/22/19 10:00 PM  Result Value Ref Range   RBC / HPF 0-5 0 - 5 RBC/hpf   WBC, UA 21-50 0 - 5 WBC/hpf   Bacteria, UA MANY (A) NONE SEEN   Squamous Epithelial / LPF 0-5 0 - 5   Non Squamous Epithelial PRESENT (A) NONE SEEN    Imaging Studies: CT Hip Left Wo Contrast  Result Date: 02/22/2019 CLINICAL DATA:  Left-sided hip pain with concern for a fracture. EXAM: CT OF THE LEFT HIP WITHOUT CONTRAST TECHNIQUE: Multidetector CT imaging of the left hip was performed according to the standard protocol. Multiplanar CT image reconstructions were also generated. COMPARISON:  None. FINDINGS: Bones/Joint/Cartilage There is diffuse osteopenia which limits detection of nondisplaced fractures. Given this limitation, no acute displaced fracture was identified. Ligaments Suboptimally assessed by CT. Muscles and Tendons Unremarkable. Soft tissues There is sigmoid diverticulosis without CT evidence for diverticulitis. Vascular calcifications are noted. IMPRESSION: Diffuse osteopenia limits detection of nondisplaced fractures. Given this limitation, no acute displaced fracture was identified. If there is persistent high clinical suspicion for an occult fracture, follow-up with MRI is recommended. Electronically Signed   By: Katherine Mantle M.D.    On: 02/22/2019 21:37   DG Femur Min 2 Views Left  Result Date: 02/22/2019 CLINICAL DATA:  Pain, acute onset left thigh pain no history of injury. EXAM: LEFT FEMUR 2 VIEWS COMPARISON:  None FINDINGS: Joint space narrowing and osteophyte formation about the left hip. Signs of bone demineralization. Signs of left total knee arthroplasty. Vascular calcifications in the soft tissues similar to prior studies of the pelvis which are available for review. No signs of acute fracture of the left femur. IMPRESSION: Degenerative changes in the hip, post left total knee arthroplasty with osteopenia and without signs of acute fracture. Electronically Signed   By: Donzetta Kohut M.D.   On: 02/22/2019 20:51   1:23 AM Still able to with bear weight on left lower extremity.  Will send to [facility], ED for emergent MRI.  Dr. Franchot Gallo accepting.     Latwan Luchsinger, Jonny Ruiz, MD 02/23/19 [phone]

## 2019-02-23 NOTE — ED Notes (Signed)
Patient verbalizes understanding of discharge instructions. Opportunity for questioning and answers were provided. Armband removed by staff, pt discharged from ED.  

## 2019-02-23 NOTE — Evaluation (Signed)
Physical Therapy Evaluation Patient Details Name: Jodi Fox MRN: [id number] DOB: [birth year] Today's Date: 02/23/2019   History of Present Illness  Patient is an 85 year old female who presented to [facility] with left hip pain. She was transfered to [facility] for an MRI of her hip. Her MRI showed no fx. it did show a hip labral tear. The patient reported no falls that she can remmeber. She had an incidious onset of pain. She has a history of bilateral knee replacement and right hip replacement. PMH: pheumatoid arthritis, panic attacks, chronic pain   Clinical Impression  Patient required significant assistance for mobility. She required max A+2 to transfer to the edge of the bed. Therapy had to control her left leg and scoot her hips while nursing helped her sit up. Once sitting she was hesitant  To weight bear on her left left and was unable to come to a full standing position. She perseverated on her normal pain medication even though she had taken stronger pain medicine prior to her MRI. She had anxiety with treatment.  She has steps to get into her house. At this time it is questionable how she will be able to get into her house and perform ADL's. She may benefit from rehabilitation. If her pain and mobility improves she will still likely need 24 hour assistance at home. Skilled therapy will continue to follow.     Follow Up Recommendations SNF;Home health PT;Supervision/Assistance - 24 hour    Equipment Recommendations  Rolling walker with 5" wheels    Recommendations for Other Services Rehab consult     Precautions / Restrictions Precautions Precautions: Fall Restrictions Weight Bearing Restrictions: No      Mobility  Bed Mobility Overal bed mobility: Needs Assistance Bed Mobility: Supine to Sit;Sit to Supine     Supine to sit: Max assist;+2 for physical assistance;+2 for safety/equipment Sit to supine: Max assist;+2 for physical assistance;+2 for  safety/equipment   General bed mobility comments: Patient required mod a to control left LE out of the bed. She required mod a of therapist to scoot hips and mod a of nursing to sit up. While sitting at the edge of the bed she reuted she needed to urinate. In bed patient was able to lift her hips up to adjust bedding. She required min a to roll to the right but mod a to roll to the left.   Transfers Overall transfer level: Needs assistance Equipment used: Rolling walker (2 wheeled) Transfers: Sit to/from Stand Sit to Stand: Max assist;+2 physical assistance;+2 safety/equipment         General transfer comment: Therapy and nursing attmepted to stand the patient enough to sit on a bed pan. Depsite max a therapy was unable to get the patient standing up straighter.   Ambulation/Gait             General Gait Details: Patient was unable to take a step despite max a +2  Stairs            Wheelchair Mobility    Modified Rankin (Stroke Patients Only)       Balance Overall balance assessment: Needs assistance Sitting-balance support: Feet supported;No upper extremity supported Sitting balance-Leahy Scale: Fair Sitting balance - Comments: gaurding for balance at the edge of the bed    Standing balance support: Bilateral upper extremity supported Standing balance-Leahy Scale: Poor Standing balance comment: max a+2 to remain standing to try to get the bed pan under her  Pertinent Vitals/Pain      Home Living Family/patient expects to be discharged to:: Private residence Living Arrangements: Spouse/significant other Available Help at Discharge: Family Type of Home: House Home Access: Stairs to enter Entrance Stairs-Rails: None Entrance Stairs-Number of Steps: 3 steps into the house  Home Layout: One level        Prior Function Level of Independence: Independent with assistive device(s)         Comments: used a walker at  times but most of the time did not use an AD.      Hand Dominance   Dominant Hand: Right    Extremity/Trunk Assessment   Upper Extremity Assessment Upper Extremity Assessment: Overall WFL for tasks assessed    Lower Extremity Assessment Lower Extremity Assessment: LLE deficits/detail LLE: Unable to fully assess due to pain;Unable to fully assess due to immobilization       Communication   Communication: No difficulties  Cognition Arousal/Alertness: Awake/alert Behavior During Therapy: Anxious                                   General Comments: Patient is very anxious. She reports she ussualy uses Oxycodoen. Nursing advised patient that she has already had streonger pain medications.       General Comments      Exercises     Assessment/Plan    PT Assessment Patient needs continued PT services  PT Problem List Decreased strength;Decreased range of motion;Decreased activity tolerance;Decreased mobility;Decreased balance;Decreased knowledge of use of DME;Decreased safety awareness;Pain       PT Treatment Interventions DME instruction;Gait training;Functional mobility training;Therapeutic activities;Therapeutic exercise;Patient/family education;Balance training;Stair training    PT Goals (Current goals can be found in the Care Plan section)  Acute Rehab PT Goals Patient Stated Goal: to have less pain in her hip  PT Goal Formulation: With patient Time For Goal Achievement: 03/09/19 Potential to Achieve Goals: Good    Frequency Min 3X/week   Barriers to discharge Inaccessible home environment;Decreased caregiver support max a to astand. patient has 2 steps into her house. She was also unable to stand enough to sit on bed pan.     Co-evaluation               AM-PAC PT "6 Clicks" Mobility  Outcome Measure Help needed turning from your back to your side while in a flat bed without using bedrails?: A Lot Help needed moving from lying on your back  to sitting on the side of a flat bed without using bedrails?: A Lot Help needed moving to and from a bed to a chair (including a wheelchair)?: Total Help needed standing up from a chair using your arms (e.g., wheelchair or bedside chair)?: Total Help needed to walk in hospital room?: Total Help needed climbing 3-5 steps with a railing? : Total 6 Click Score: 8    End of Session         PT Visit Diagnosis: Unsteadiness on feet (R26.81);Muscle weakness (generalized) (M62.81);Difficulty in walking, not elsewhere classified (R26.2);Pain Pain - Right/Left: Left Pain - part of body: Hip    Time: 4580-9983 PT Time Calculation (min) (ACUTE ONLY): 35 min   Charges:   PT Evaluation $PT Eval Moderate Complexity: 1 Mod            Carney Living PT DPT  02/23/2019, 12:01 PM

## 2019-02-23 NOTE — Discharge Instructions (Addendum)
Take antibiotics as prescribed for your urinary tract infection, complete the full course. Take Norco as needed as prescribed for pain.  This can cause constipation, you should take a Colace as needed. Follow-up with your orthopedist in the next week.

## 2019-02-23 NOTE — ED Notes (Signed)
  Patients husband called at bedside and explained that patient is being sent to [facility] for an MRI to determine if she has a femur fx.  Patient signed consent form and husband agreed with plan of care.

## 2019-02-23 NOTE — ED Notes (Signed)
  Attempted to ambulate patient with assistance and patient would not tolerate.  Patient was helped to side of the bed and refused any weight bearing or ambulation.  Dr Florina Ou notified.

## 2019-02-23 NOTE — ED Notes (Signed)
Pt comes via [facility] for MRI of L hip, pt given 100 mcg of fentanyl with EMS PTA

## 2019-02-23 NOTE — TOC Initial Note (Signed)
Transition of Care [facility]) - Initial/Assessment Note    Patient Details  Name: Jodi Fox MRN: [id number] Date of Birth: 1933/12/09  [facility] CM/SW Contact:    Oretha Milch, LCSW Phone Number: 02/23/2019, 2:23 PM  Clinical Narrative: CSW met with patient to discuss SNF referral. CSW informed patient of concerns noted by physical therapy and recommendation for SNF placement. CSW noted patient appeared apprehensive over SNF referral. CSW discussed the nature of a convalescent inpatient stay up to 30 days. CSW noted patient requested for CSW to follow-up later in the day as she needed time to discuss it with her family.                   Expected Discharge Plan: Skilled Nursing Facility Barriers to Discharge: Family Issues, Insurance Authorization, No SNF bed   Patient Goals and CMS Choice Patient states their goals for this hospitalization and ongoing recovery are:: "I want to be back home." CMS Medicare.gov Compare Post Acute Care list provided to:: Patient Choice offered to / list presented to : Patient  Expected Discharge Plan and Services Expected Discharge Plan: [place]     Post Acute Care Choice: NA(In decision making process)                                        Prior Living Arrangements/Services     Patient language and need for interpreter reviewed:: Yes Do you feel safe going back to the place where you live?: Yes      Need for Family Participation in Patient Care: Yes (Comment) Care giver support system in place?: No (comment) Current home services: DME Criminal Activity/Legal Involvement Pertinent to Current Situation/Hospitalization: No - Comment as needed  Activities of Daily Living      Permission Sought/Granted Permission sought to share information with : Facility Art therapist granted to share information with : Yes, Verbal Permission Granted(Pending decision to go to SNF or not)             Emotional Assessment Appearance:: Appears stated age Attitude/Demeanor/Rapport: Guarded, Charismatic Affect (typically observed): Apprehensive, Calm Orientation: : Oriented to Situation, Oriented to  Time, Oriented to Place, Oriented to Self Alcohol / Substance Use: Not Applicable Psych Involvement: No (comment)  Admission diagnosis:  thigh pain There are no problems to display for this patient.  PCP:  Reita Cliche, MD Pharmacy:   [id number] [place] [place] [address]Phone: [phone]: [phone] [facility] [address]Phone: [phone]: [phone]    Social Determinants of Health (SDOH) Interventions    Readmission Risk Interventions No flowsheet data found.

## 2020-08-21 IMAGING — MR MR HIP*L* W/O CM
6 series · 40 of 40 positions shown · non-contrast
Comparison: CT left hip from yesterday.

CLINICAL DATA: Acute left upper leg pain since yesterday. Denies
trauma.

EXAM:
MR OF THE LEFT HIP WITHOUT CONTRAST
TECHNIQUE: Multiplanar, multisequence MR imaging was performed. No intravenous
contrast was administered.

[Series 7: T1 · coronal · left · 4.0mm · 1.17mm/px · 7 of 40 slices shown]
[im 1/40]
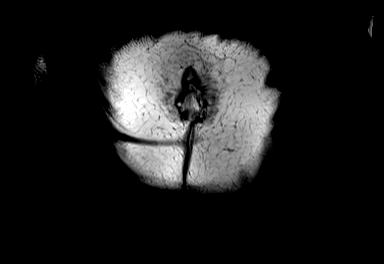
[im 7/40]
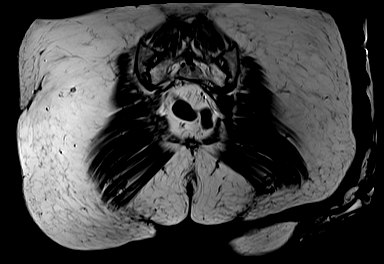
[im 14/40]
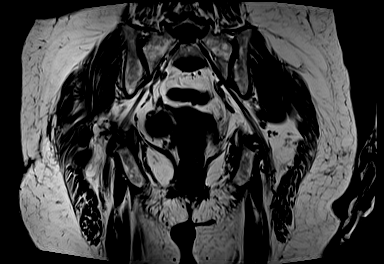
[im 20/40]
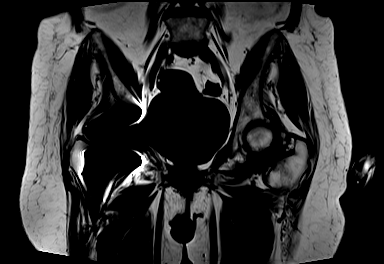
[im 27/40]
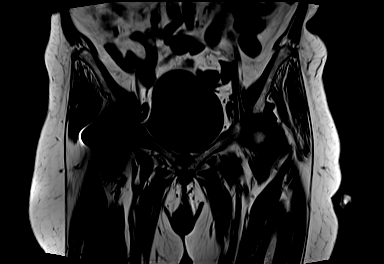
[im 33/40]
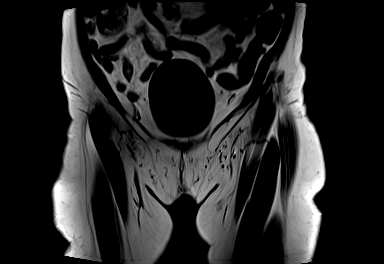
[im 40/40]
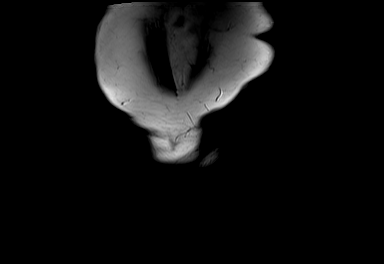

[Series 8: T2 fat-sat · coronal · left · 4.0mm · 1.00mm/px · 8 of 40 slices shown (1 of 2)]
[im 1/40]
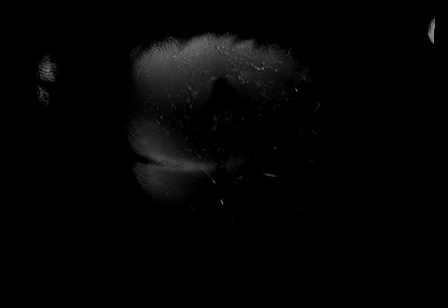
[im 6/40]
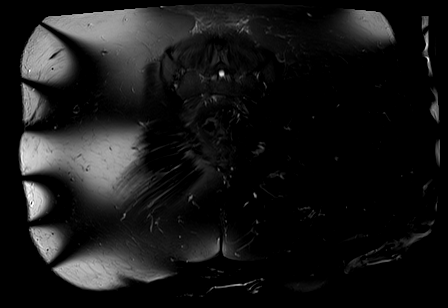
[im 12/40]
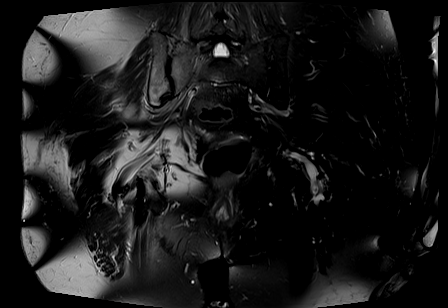
[im 17/40]
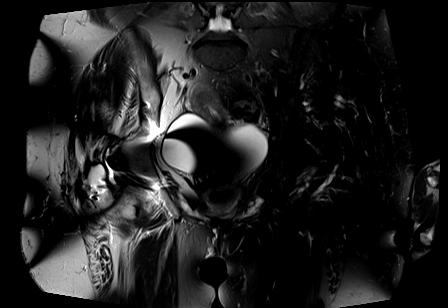
[im 23/40]
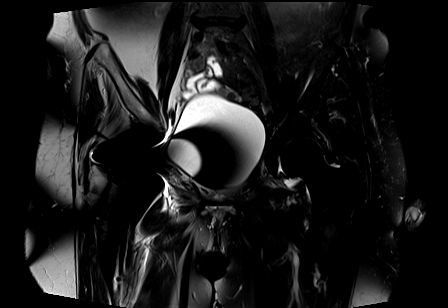
[im 28/40]
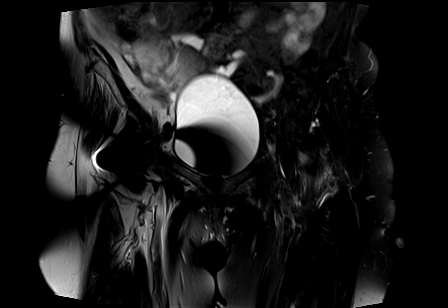
[im 34/40]
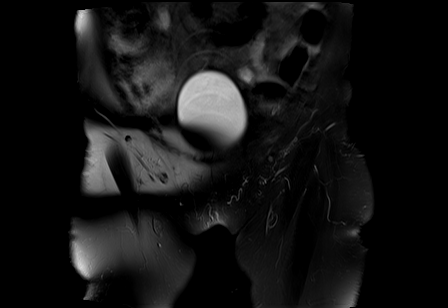
[im 40/40]
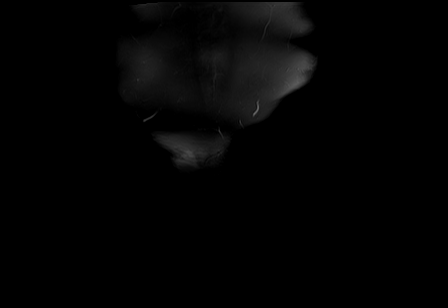

[Series 9: T2 fat-sat · axial · left · 4.0mm · 0.35mm/px · z∈[-102,+68]mm · 7 of 35 slices shown (2 of 2)]
[im 1/35]
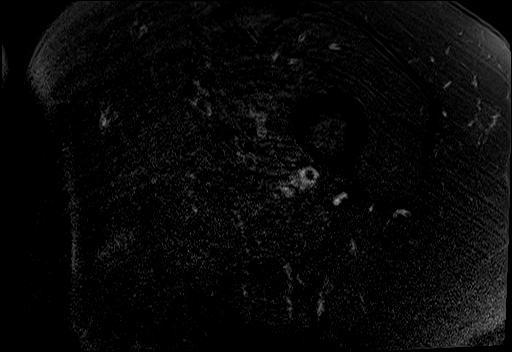
[im 6/35]
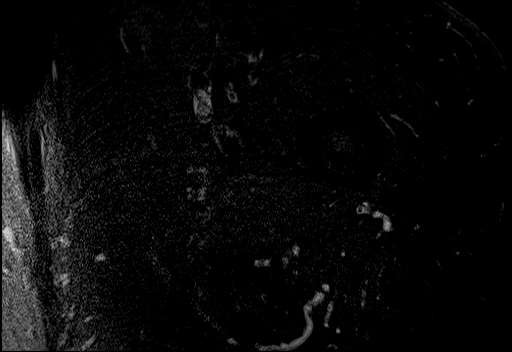
[im 12/35]
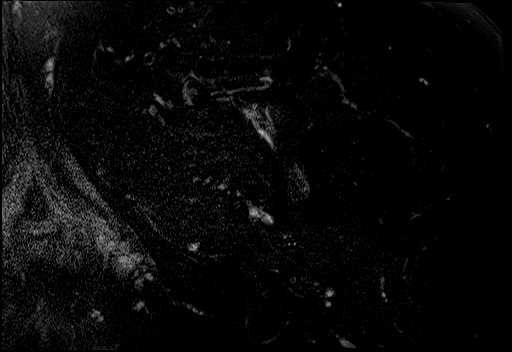
[im 18/35]
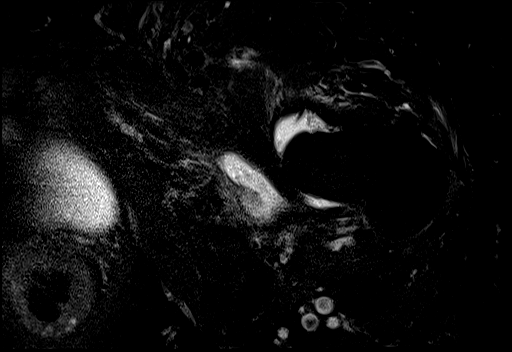
[im 23/35]
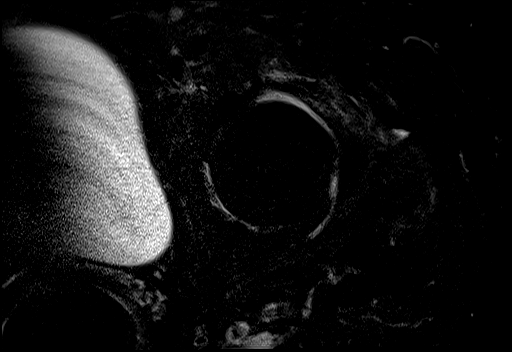
[im 29/35]
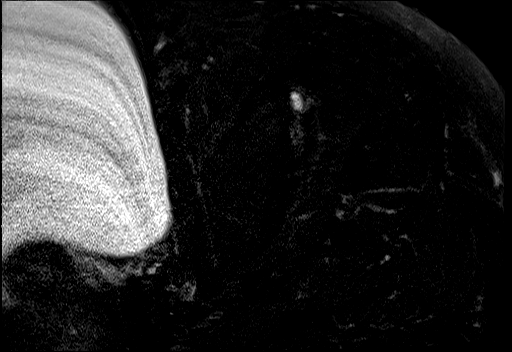
[im 35/35]
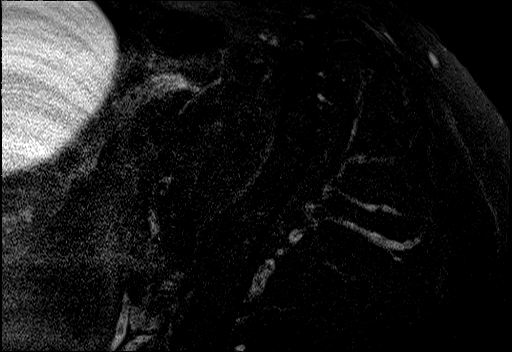

[Series 10: PD fat-sat · sagittal · left · 4.0mm · 0.56mm/px · 6 of 28 slices shown (1 of 2)]
[im 1/28]
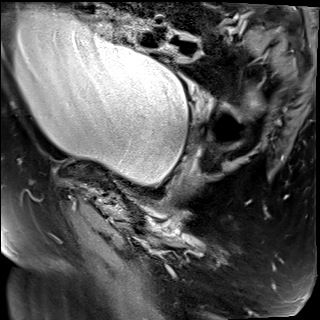
[im 6/28]
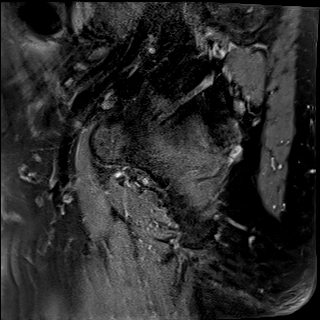
[im 11/28]
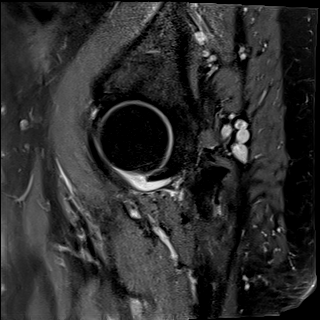
[im 17/28]
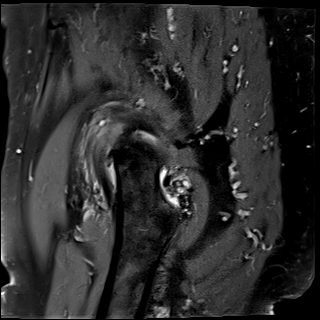
[im 22/28]
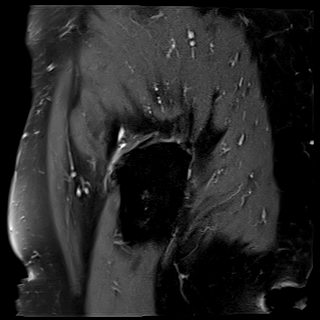
[im 28/28]
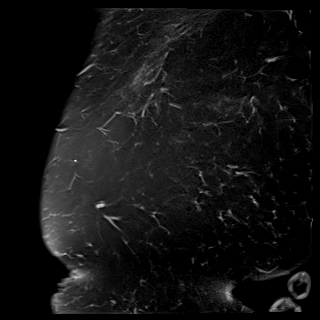

[Series 11: PD fat-sat · coronal · left · 3.0mm · 0.70mm/px · 4 of 20 slices shown (2 of 2)]
[im 1/20]
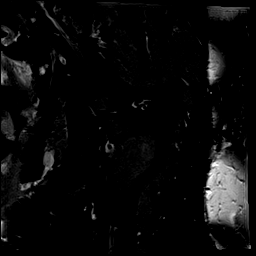
[im 7/20]
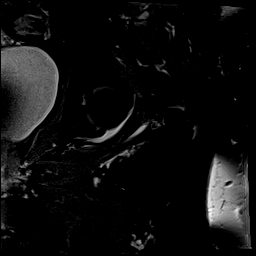
[im 13/20]
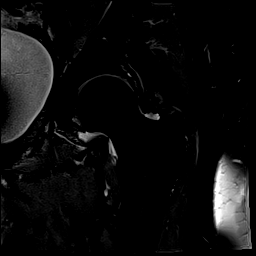
[im 20/20]
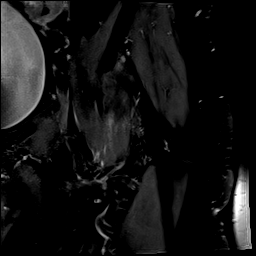

[Series 12: STIR · coronal · left · 4.0mm · 1.04mm/px · 8 of 40 slices shown]
[im 1/40]
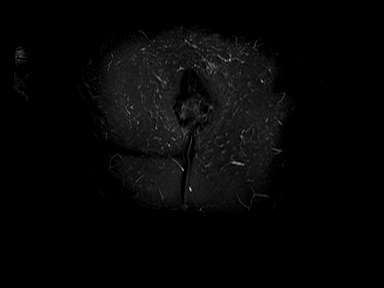
[im 6/40]
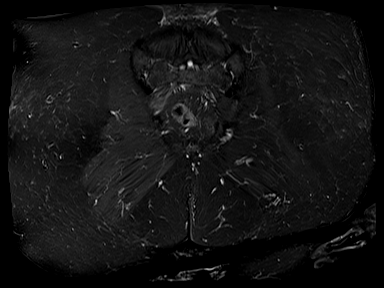
[im 12/40]
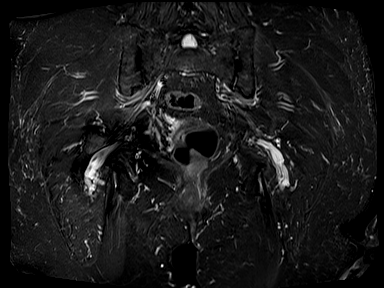
[im 17/40]
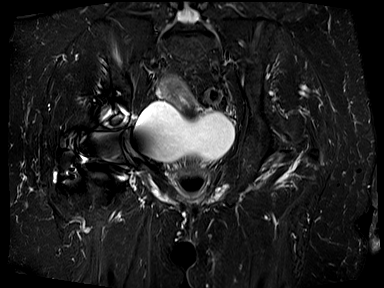
[im 23/40]
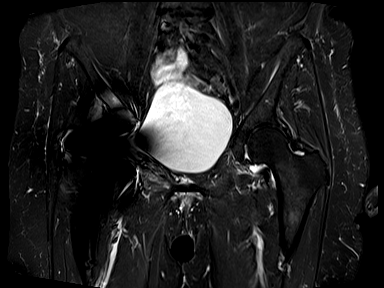
[im 28/40]
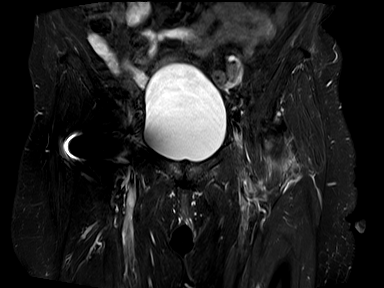
[im 34/40]
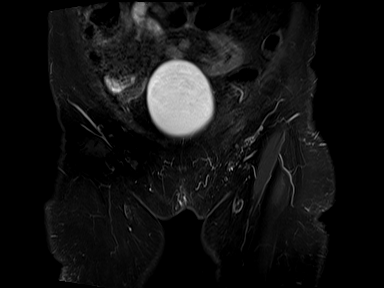
[im 40/40]
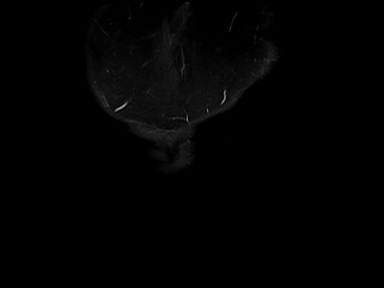

[40 of 40 positions shown; findings below may reference images not displayed]

FINDINGS: Bones: There is no evidence of acute fracture, dislocation or
avascular necrosis. Prior right total hip arthroplasty. No focal
bone lesion. Degenerative changes of the pubic symphysis. The
sacroiliac joints are unremarkable.

Articular cartilage and labrum

Articular cartilage: Mild diffuse cartilage thinning without focal
defect.

Labrum: Degenerated and torn anterior superior labrum with 1.0 x
x 1.5 cm paralabral cyst.

Joint or bursal effusion

Joint effusion: Small left hip joint effusion.

Bursae: Small amount of fluid in the right greater trochanteric
bursa.

Muscles and tendons

Muscles and tendons: Mild bilateral hamstring tendinosis. The
visualized gluteus and iliopsoas tendons are intact. No muscle
edema. Mild atrophy of the right piriformis muscle.

Other findings

Miscellaneous: Sigmoid diverticulosis. The visualized internal
pelvic contents otherwise appear unremarkable.
IMPRESSION: 1. No acute osseous abnormality.
2. Left anterior superior labral tear with 1.5 cm paralabral cyst.
3. Mild left hip osteoarthritis.
